# Patient Record
Sex: Female | Born: 1955 | Race: Black or African American | Hispanic: No | Marital: Single | Smoking: Never smoker
Health system: Southern US, Community
[De-identification: ages and names within clinical notes are randomized; demographics above are authoritative.]

## PROBLEM LIST (undated history)

## (undated) DIAGNOSIS — T8859XA Other complications of anesthesia, initial encounter: Secondary | ICD-10-CM

## (undated) DIAGNOSIS — T4145XA Adverse effect of unspecified anesthetic, initial encounter: Secondary | ICD-10-CM

## (undated) DIAGNOSIS — I10 Essential (primary) hypertension: Secondary | ICD-10-CM

## (undated) DIAGNOSIS — M1990 Unspecified osteoarthritis, unspecified site: Secondary | ICD-10-CM

## (undated) DIAGNOSIS — H409 Unspecified glaucoma: Secondary | ICD-10-CM

## (undated) DIAGNOSIS — D649 Anemia, unspecified: Secondary | ICD-10-CM

## (undated) DIAGNOSIS — G5600 Carpal tunnel syndrome, unspecified upper limb: Secondary | ICD-10-CM

## (undated) HISTORY — PX: VAGINAL HYSTERECTOMY: SUR661

## (undated) HISTORY — PX: COLONOSCOPY: SHX174

## (undated) HISTORY — PX: WISDOM TOOTH EXTRACTION: SHX21

## (undated) HISTORY — PX: BUNIONECTOMY: SHX129

---

## 2016-06-25 ENCOUNTER — Other Ambulatory Visit: Payer: Self-pay | Admitting: Neurosurgery

## 2016-07-18 ENCOUNTER — Other Ambulatory Visit (HOSPITAL_COMMUNITY): Payer: Self-pay | Admitting: *Deleted

## 2016-07-18 ENCOUNTER — Encounter (HOSPITAL_COMMUNITY)
Admission: RE | Admit: 2016-07-18 | Discharge: 2016-07-18 | Disposition: A | Discharge status: Home / Self Care | Payer: PRIVATE HEALTH INSURANCE | Source: Ambulatory Visit | Attending: Neurosurgery | Admitting: Neurosurgery

## 2016-07-18 ENCOUNTER — Encounter (HOSPITAL_COMMUNITY): Payer: Self-pay

## 2016-07-18 DIAGNOSIS — Z01812 Encounter for preprocedural laboratory examination: Secondary | ICD-10-CM | POA: Diagnosis present

## 2016-07-18 DIAGNOSIS — Z01810 Encounter for preprocedural cardiovascular examination: Secondary | ICD-10-CM | POA: Insufficient documentation

## 2016-07-18 DIAGNOSIS — I10 Essential (primary) hypertension: Secondary | ICD-10-CM | POA: Diagnosis not present

## 2016-07-18 HISTORY — DX: Unspecified glaucoma: H40.9

## 2016-07-18 HISTORY — DX: Anemia, unspecified: D64.9

## 2016-07-18 HISTORY — DX: Carpal tunnel syndrome, unspecified upper limb: G56.00

## 2016-07-18 HISTORY — DX: Other complications of anesthesia, initial encounter: T88.59XA

## 2016-07-18 HISTORY — DX: Adverse effect of unspecified anesthetic, initial encounter: T41.45XA

## 2016-07-18 HISTORY — DX: Unspecified osteoarthritis, unspecified site: M19.90

## 2016-07-18 HISTORY — DX: Essential (primary) hypertension: I10

## 2016-07-18 LAB — CBC
HEMATOCRIT: 38.3 % (ref 36.0–46.0)
HEMOGLOBIN: 12.5 g/dL (ref 12.0–15.0)
MCH: 23.2 pg — AB (ref 26.0–34.0)
MCHC: 32.6 g/dL (ref 30.0–36.0)
MCV: 71.1 fL — ABNORMAL LOW (ref 78.0–100.0)
Platelets: 289 10*3/uL (ref 150–400)
RBC: 5.39 MIL/uL — ABNORMAL HIGH (ref 3.87–5.11)
RDW: 15.5 % (ref 11.5–15.5)
WBC: 6.1 10*3/uL (ref 4.0–10.5)

## 2016-07-18 LAB — BASIC METABOLIC PANEL
Anion gap: 8 (ref 5–15)
BUN: 16 mg/dL (ref 6–20)
CALCIUM: 9.9 mg/dL (ref 8.9–10.3)
CO2: 25 mmol/L (ref 22–32)
Chloride: 105 mmol/L (ref 101–111)
Creatinine, Ser: 0.89 mg/dL (ref 0.44–1.00)
GFR calc Af Amer: >60 mL/min (ref >60)
GFR calc non Af Amer: >60 mL/min (ref >60)
GLUCOSE: 95 mg/dL (ref 65–99)
POTASSIUM: 4.3 mmol/L (ref 3.5–5.1)
Sodium: 138 mmol/L (ref 135–145)

## 2016-07-18 LAB — SURGICAL PCR SCREEN
MRSA, PCR: NEGATIVE
Staphylococcus aureus: NEGATIVE

## 2016-07-18 LAB — TYPE AND SCREEN
ABO/RH(D): B POS
ANTIBODY SCREEN: NEGATIVE

## 2016-07-18 LAB — ABO/RH: ABO/RH(D): B POS

## 2016-07-18 NOTE — Progress Notes (Signed)
Pt denies cardiac history, chest pain or sob. Pt states she is not diabetic. 

## 2016-07-18 NOTE — Pre-Procedure Instructions (Signed)
Nicoletta Dressatricia Grippi  07/18/2016    Your procedure is scheduled on Friday, July 26, 2016 at 11:00 AM.   [facility] En[facility] "A" Admitting Office at 8:00 AM.   Call this number if you have problems the morning of surgery: [phone]   Questions prior to day of surgery, please call [phone] between 8 & 4 PM.   Remember:  Do not eat food or drink liquids after midnight Thursday, 07/25/16.  Take these medicines the morning of surgery with A SIP OF WATER: Amlodipine (Norvasc), Hydrocodone - if needed  Stop Multivitamins 7 days prior to surgery. Do not use Aspirin products or NSAIDS (Aleve, Ibuprofen, etc.) 7 days prior to surgery.   Do not wear jewelry, make-up or nail polish.  Do not wear lotions, powders, or perfumes.  Do not shave 48 hours prior to surgery.    Do not bring valuables to the hospital.  [facility] is not responsible for any belongings or valuables.  Contacts, dentures or bridgework may not be worn into surgery.  Leave your suitcase in the car.  After surgery it may be brought to your room.  For patients admitted to the hospital, discharge time will be determined by your treatment team.  Special instructions:  [facility] - Preparing for Surgery  Before surgery, you can play an important role.  Because skin is not sterile, your skin needs to be as free of germs as possible.  You can reduce the number of germs on you skin by washing with CHG (chlorahexidine gluconate) soap before surgery.  CHG is an antiseptic cleaner which kills germs and bonds with the skin to continue killing germs even after washing.  Please DO NOT use if you have an allergy to CHG or antibacterial soaps.  If your skin becomes reddened/irritated stop using the CHG and inform your nurse when you arrive at Short Stay.  Do not shave (including legs and underarms) for at least 48 hours prior to the first CHG shower.  You may shave your face.  Please follow these instructions  carefully:   1.  Shower with CHG Soap the night before surgery and the                    morning of Surgery.  2.  If you choose to wash your hair, wash your hair first as usual with your       normal shampoo.  3.  After you shampoo, rinse your hair and body thoroughly to remove the shampoo.  4.  Use CHG as you would any other liquid soap.  You can apply chg directly       to the skin and wash gently with scrungie or a clean washcloth.  5.  Apply the CHG Soap to your body ONLY FROM THE NECK DOWN.        Do not use on open wounds or open sores.  Avoid contact with your eyes, ears, mouth and genitals (private parts).  Wash genitals (private parts) with your normal soap.  6.  Wash thoroughly, paying special attention to the area where your surgery        will be performed.  7.  Thoroughly rinse your body with warm water from the neck down.  8.  DO NOT shower/wash with your normal soap after using and rinsing off       the CHG Soap.  9.  Pat yourself dry with a clean towel.  10.  Wear clean pajamas.            11.  Place clean sheets on your bed the night of your first shower and do not        sleep with pets.  Day of Surgery  Do not apply any lotions the morning of surgery.  Please wear clean clothes to the hospital.   Please read over the fact sheets that you were given.

## 2016-07-26 ENCOUNTER — Inpatient Hospital Stay (HOSPITAL_COMMUNITY): Payer: PRIVATE HEALTH INSURANCE | Admitting: Anesthesiology

## 2016-07-26 ENCOUNTER — Inpatient Hospital Stay (HOSPITAL_COMMUNITY): Payer: PRIVATE HEALTH INSURANCE | Admitting: Emergency Medicine

## 2016-07-26 ENCOUNTER — Inpatient Hospital Stay (HOSPITAL_COMMUNITY): Payer: PRIVATE HEALTH INSURANCE

## 2016-07-26 ENCOUNTER — Encounter (HOSPITAL_COMMUNITY): Payer: Self-pay | Admitting: Anesthesiology

## 2016-07-26 ENCOUNTER — Observation Stay (HOSPITAL_COMMUNITY)
Admission: RE | Admit: 2016-07-26 | Discharge: 2016-07-28 | DRG: 460 | Disposition: A | Discharge status: Home / Self Care | Payer: PRIVATE HEALTH INSURANCE | Source: Ambulatory Visit | Attending: Neurosurgery | Admitting: Neurosurgery

## 2016-07-26 ENCOUNTER — Encounter (HOSPITAL_COMMUNITY)
Admission: RE | Disposition: A | Discharge status: Home / Self Care | Payer: Self-pay | Source: Ambulatory Visit | Attending: Neurosurgery

## 2016-07-26 DIAGNOSIS — M4807 Spinal stenosis, lumbosacral region: Secondary | ICD-10-CM | POA: Diagnosis not present

## 2016-07-26 DIAGNOSIS — M5117 Intervertebral disc disorders with radiculopathy, lumbosacral region: Secondary | ICD-10-CM | POA: Diagnosis not present

## 2016-07-26 DIAGNOSIS — I10 Essential (primary) hypertension: Secondary | ICD-10-CM | POA: Insufficient documentation

## 2016-07-26 DIAGNOSIS — Z79899 Other long term (current) drug therapy: Secondary | ICD-10-CM | POA: Diagnosis not present

## 2016-07-26 DIAGNOSIS — M5116 Intervertebral disc disorders with radiculopathy, lumbar region: Secondary | ICD-10-CM | POA: Diagnosis present

## 2016-07-26 DIAGNOSIS — M4316 Spondylolisthesis, lumbar region: Secondary | ICD-10-CM | POA: Diagnosis present

## 2016-07-26 DIAGNOSIS — Z419 Encounter for procedure for purposes other than remedying health state, unspecified: Secondary | ICD-10-CM

## 2016-07-26 HISTORY — PX: MAXIMUM ACCESS (MAS)POSTERIOR LUMBAR INTERBODY FUSION (PLIF) 2 LEVEL: SHX6369

## 2016-07-26 SURGERY — FOR MAXIMUM ACCESS (MAS) POSTERIOR LUMBAR INTERBODY FUSION (PLIF) 2 LEVEL
Anesthesia: General | Site: Back

## 2016-07-26 MED ORDER — LIDOCAINE HCL (CARDIAC) 20 MG/ML IV SOLN
INTRAVENOUS | Status: DC | PRN
  Administered 2016-07-26: 60 mg via INTRATRACHEAL

## 2016-07-26 MED ORDER — FENTANYL CITRATE (PF) 100 MCG/2ML IJ SOLN
INTRAMUSCULAR | Status: AC
  Filled 2016-07-26: qty 2

## 2016-07-26 MED ORDER — ACETAMINOPHEN 10 MG/ML IV SOLN
INTRAVENOUS | Status: DC | PRN
  Administered 2016-07-26: 1000 mg via INTRAVENOUS

## 2016-07-26 MED ORDER — CEFAZOLIN SODIUM-DEXTROSE 2-4 GM/100ML-% IV SOLN
2.0000 g | INTRAVENOUS | Status: AC
  Administered 2016-07-26: 2 g via INTRAVENOUS
  Filled 2016-07-26: qty 100

## 2016-07-26 MED ORDER — BUPIVACAINE LIPOSOME 1.3 % IJ SUSP
20.0000 mL | INTRAMUSCULAR | Status: AC
  Administered 2016-07-26: 20 mL
  Filled 2016-07-26: qty 20

## 2016-07-26 MED ORDER — METHOCARBAMOL 500 MG PO TABS
500.0000 mg | ORAL_TABLET | Freq: Four times a day (QID) | ORAL | Status: DC | PRN
  Administered 2016-07-26 – 2016-07-28 (×5): 500 mg via ORAL
  Filled 2016-07-26 (×5): qty 1

## 2016-07-26 MED ORDER — SODIUM CHLORIDE 0.9% FLUSH: 3.0000 mL | INTRAVENOUS | Status: DC | PRN

## 2016-07-26 MED ORDER — LIDOCAINE-EPINEPHRINE (PF) 2 %-1:200000 IJ SOLN
INTRAMUSCULAR | Status: AC
  Filled 2016-07-26: qty 20

## 2016-07-26 MED ORDER — LIDOCAINE-EPINEPHRINE (PF) 2 %-1:200000 IJ SOLN
INTRAMUSCULAR | Status: DC | PRN
  Administered 2016-07-26: 5 mL

## 2016-07-26 MED ORDER — AMLODIPINE BESYLATE 10 MG PO TABS
10.0000 mg | ORAL_TABLET | Freq: Every day | ORAL | Status: DC
  Administered 2016-07-27: 10 mg via ORAL
  Filled 2016-07-26: qty 1

## 2016-07-26 MED ORDER — FENTANYL CITRATE (PF) 250 MCG/5ML IJ SOLN
INTRAMUSCULAR | Status: DC | PRN
  Administered 2016-07-26: 100 ug via INTRAVENOUS
  Administered 2016-07-26 (×2): 50 ug via INTRAVENOUS
  Administered 2016-07-26: 100 ug via INTRAVENOUS
  Administered 2016-07-26 (×2): 50 ug via INTRAVENOUS

## 2016-07-26 MED ORDER — OXYCODONE-ACETAMINOPHEN 5-325 MG PO TABS
1.0000 | ORAL_TABLET | ORAL | Status: DC | PRN
  Administered 2016-07-26 – 2016-07-27 (×3): 2 via ORAL
  Administered 2016-07-27: 1 via ORAL
  Administered 2016-07-27 – 2016-07-28 (×2): 2 via ORAL
  Administered 2016-07-28: 1 via ORAL
  Filled 2016-07-26 (×6): qty 2
  Filled 2016-07-26 (×2): qty 1

## 2016-07-26 MED ORDER — METHOCARBAMOL 1000 MG/10ML IJ SOLN
500.0000 mg | Freq: Four times a day (QID) | INTRAVENOUS | Status: DC | PRN
  Filled 2016-07-26: qty 5

## 2016-07-26 MED ORDER — HYDROMORPHONE HCL 1 MG/ML IJ SOLN
0.2500 mg | INTRAMUSCULAR | Status: DC | PRN
  Administered 2016-07-26 (×3): 0.5 mg via INTRAVENOUS

## 2016-07-26 MED ORDER — SODIUM CHLORIDE 0.9 % IV SOLN: 250.0000 mL | INTRAVENOUS | Status: DC

## 2016-07-26 MED ORDER — PROPOFOL 10 MG/ML IV BOLUS
INTRAVENOUS | Status: AC
  Filled 2016-07-26: qty 20

## 2016-07-26 MED ORDER — PROPOFOL 10 MG/ML IV BOLUS
INTRAVENOUS | Status: DC | PRN
  Administered 2016-07-26: 170 mg via INTRAVENOUS

## 2016-07-26 MED ORDER — HYDROMORPHONE HCL 1 MG/ML IJ SOLN
0.5000 mg | INTRAMUSCULAR | Status: DC | PRN
  Administered 2016-07-26 – 2016-07-27 (×2): 1 mg via INTRAVENOUS
  Filled 2016-07-26 (×2): qty 1

## 2016-07-26 MED ORDER — THROMBIN 20000 UNITS EX SOLR
CUTANEOUS | Status: DC | PRN
  Administered 2016-07-26: 13:00:00 via TOPICAL

## 2016-07-26 MED ORDER — HYDROMORPHONE HCL 1 MG/ML IJ SOLN
INTRAMUSCULAR | Status: AC
  Filled 2016-07-26: qty 0.5

## 2016-07-26 MED ORDER — ONDANSETRON HCL 4 MG/2ML IJ SOLN
4.0000 mg | INTRAMUSCULAR | Status: DC | PRN
  Administered 2016-07-26: 4 mg via INTRAVENOUS
  Filled 2016-07-26: qty 2

## 2016-07-26 MED ORDER — CEFAZOLIN IN D5W 1 GM/50ML IV SOLN
1.0000 g | Freq: Three times a day (TID) | INTRAVENOUS | Status: AC
  Administered 2016-07-26 – 2016-07-27 (×2): 1 g via INTRAVENOUS
  Filled 2016-07-26 (×2): qty 50

## 2016-07-26 MED ORDER — CHLORHEXIDINE GLUCONATE CLOTH 2 % EX PADS: 6.0000 | MEDICATED_PAD | Freq: Once | CUTANEOUS | Status: DC

## 2016-07-26 MED ORDER — ACETAMINOPHEN 325 MG PO TABS: 650.0000 mg | ORAL_TABLET | ORAL | Status: DC | PRN

## 2016-07-26 MED ORDER — MENTHOL 3 MG MT LOZG: 1.0000 | LOZENGE | OROMUCOSAL | Status: DC | PRN

## 2016-07-26 MED ORDER — MIDAZOLAM HCL 2 MG/2ML IJ SOLN
INTRAMUSCULAR | Status: AC
  Filled 2016-07-26: qty 2

## 2016-07-26 MED ORDER — THROMBIN 5000 UNITS EX SOLR
CUTANEOUS | Status: AC
  Filled 2016-07-26: qty 5000

## 2016-07-26 MED ORDER — 0.9 % SODIUM CHLORIDE (POUR BTL) OPTIME
TOPICAL | Status: DC | PRN
  Administered 2016-07-26: 1000 mL

## 2016-07-26 MED ORDER — PHENYLEPHRINE HCL 10 MG/ML IJ SOLN
INTRAMUSCULAR | Status: DC | PRN
  Administered 2016-07-26 (×2): 40 ug via INTRAVENOUS
  Administered 2016-07-26: 80 ug via INTRAVENOUS

## 2016-07-26 MED ORDER — SODIUM CHLORIDE 0.9% FLUSH
3.0000 mL | Freq: Two times a day (BID) | INTRAVENOUS | Status: DC
  Administered 2016-07-26 – 2016-07-27 (×2): 3 mL via INTRAVENOUS

## 2016-07-26 MED ORDER — LACTATED RINGERS IV SOLN
INTRAVENOUS | Status: DC | PRN
  Administered 2016-07-26 (×4): via INTRAVENOUS

## 2016-07-26 MED ORDER — ACETAMINOPHEN 10 MG/ML IV SOLN
INTRAVENOUS | Status: AC
  Filled 2016-07-26: qty 100

## 2016-07-26 MED ORDER — HYDROCODONE-ACETAMINOPHEN 5-325 MG PO TABS
1.0000 | ORAL_TABLET | ORAL | Status: DC | PRN
  Administered 2016-07-26 – 2016-07-28 (×2): 2 via ORAL
  Filled 2016-07-26: qty 2

## 2016-07-26 MED ORDER — BUPIVACAINE HCL (PF) 0.5 % IJ SOLN
INTRAMUSCULAR | Status: DC | PRN
  Administered 2016-07-26: 5 mL

## 2016-07-26 MED ORDER — ZOLPIDEM TARTRATE 5 MG PO TABS: 5.0000 mg | ORAL_TABLET | Freq: Every evening | ORAL | Status: DC | PRN

## 2016-07-26 MED ORDER — MIDAZOLAM HCL 5 MG/5ML IJ SOLN
INTRAMUSCULAR | Status: DC | PRN
  Administered 2016-07-26: 2 mg via INTRAVENOUS

## 2016-07-26 MED ORDER — SUCCINYLCHOLINE CHLORIDE 20 MG/ML IJ SOLN
INTRAMUSCULAR | Status: DC | PRN
  Administered 2016-07-26: 120 mg via INTRAVENOUS

## 2016-07-26 MED ORDER — SENNOSIDES-DOCUSATE SODIUM 8.6-50 MG PO TABS: 1.0000 | ORAL_TABLET | Freq: Every evening | ORAL | Status: DC | PRN

## 2016-07-26 MED ORDER — DOCUSATE SODIUM 100 MG PO CAPS
100.0000 mg | ORAL_CAPSULE | Freq: Two times a day (BID) | ORAL | Status: DC
  Administered 2016-07-26 – 2016-07-28 (×4): 100 mg via ORAL
  Filled 2016-07-26 (×4): qty 1

## 2016-07-26 MED ORDER — PHENOL 1.4 % MT LIQD: 1.0000 | OROMUCOSAL | Status: DC | PRN

## 2016-07-26 MED ORDER — THROMBIN 20000 UNITS EX SOLR
CUTANEOUS | Status: AC
  Filled 2016-07-26: qty 20000

## 2016-07-26 MED ORDER — KCL IN DEXTROSE-NACL 20-5-0.45 MEQ/L-%-% IV SOLN
INTRAVENOUS | Status: DC
  Administered 2016-07-26: 20:00:00 via INTRAVENOUS
  Filled 2016-07-26 (×2): qty 1000

## 2016-07-26 MED ORDER — FLEET ENEMA 7-19 GM/118ML RE ENEM: 1.0000 | ENEMA | Freq: Once | RECTAL | Status: DC | PRN

## 2016-07-26 MED ORDER — ACETAMINOPHEN 650 MG RE SUPP: 650.0000 mg | RECTAL | Status: DC | PRN

## 2016-07-26 MED ORDER — BISACODYL 10 MG RE SUPP: 10.0000 mg | Freq: Every day | RECTAL | Status: DC | PRN

## 2016-07-26 MED ORDER — ALBUMIN HUMAN 5 % IV SOLN
INTRAVENOUS | Status: DC | PRN
  Administered 2016-07-26: 15:00:00 via INTRAVENOUS

## 2016-07-26 MED ORDER — LACTATED RINGERS IV SOLN
INTRAVENOUS | Status: DC
  Administered 2016-07-26: 08:00:00 via INTRAVENOUS

## 2016-07-26 MED ORDER — HYDROCODONE-ACETAMINOPHEN 5-325 MG PO TABS
1.0000 | ORAL_TABLET | Freq: Three times a day (TID) | ORAL | Status: DC | PRN
  Administered 2016-07-27: 1 via ORAL
  Filled 2016-07-26: qty 1

## 2016-07-26 MED ORDER — PANTOPRAZOLE SODIUM 40 MG IV SOLR
40.0000 mg | Freq: Every day | INTRAVENOUS | Status: DC
  Administered 2016-07-26 – 2016-07-27 (×2): 40 mg via INTRAVENOUS
  Filled 2016-07-26 (×2): qty 40

## 2016-07-26 MED ORDER — HYDROCODONE-ACETAMINOPHEN 5-325 MG PO TABS
ORAL_TABLET | ORAL | Status: AC
  Filled 2016-07-26: qty 2

## 2016-07-26 MED ORDER — ALUM & MAG HYDROXIDE-SIMETH 200-200-20 MG/5ML PO SUSP: 30.0000 mL | Freq: Four times a day (QID) | ORAL | Status: DC | PRN

## 2016-07-26 MED ORDER — ADULT MULTIVITAMIN W/MINERALS CH
1.0000 | ORAL_TABLET | Freq: Every day | ORAL | Status: DC
  Administered 2016-07-27 – 2016-07-28 (×2): 1 via ORAL
  Filled 2016-07-26 (×3): qty 1

## 2016-07-26 MED FILL — Heparin Sodium (Porcine) Inj 1000 Unit/ML: INTRAMUSCULAR | Qty: 30 | Status: AC

## 2016-07-26 MED FILL — Sodium Chloride IV Soln 0.9%: INTRAVENOUS | Qty: 1000 | Status: AC

## 2016-07-26 SURGICAL SUPPLY — 86 items
BASKET BONE COLLECTION (BASKET) ×2 IMPLANT
BLADE CLIPPER SURG (BLADE) IMPLANT
BONE MATRIX OSTEOCEL PRO MED (Bone Implant) ×3 IMPLANT
BUR MATCHSTICK NEURO 3.0 LAGG (BURR) ×3 IMPLANT
BUR PRECISION FLUTE 5.0 (BURR) IMPLANT
BUR ROUND FLUTED 5 RND (BURR) ×2 IMPLANT
BUR ROUND FLUTED 5MM RND (BURR) ×1
CAGE COROENT LG 10X9X23-12 (Cage) ×12 IMPLANT
CANISTER SUCT 3000ML PPV (MISCELLANEOUS) ×3 IMPLANT
CAP RELINE MOD TULIP RMM (Cap) ×6 IMPLANT
CARTRIDGE OIL MAESTRO DRILL (MISCELLANEOUS) ×1 IMPLANT
CLIP NEUROVISION LG (CLIP) ×3 IMPLANT
CONT SPEC 4OZ CLIKSEAL STRL BL (MISCELLANEOUS) ×3 IMPLANT
COVER BACK TABLE 24X17X13 BIG (DRAPES) IMPLANT
COVER BACK TABLE 60X90IN (DRAPES) ×3 IMPLANT
DECANTER SPIKE VIAL GLASS SM (MISCELLANEOUS) ×3 IMPLANT
DERMABOND ADVANCED (GAUZE/BANDAGES/DRESSINGS) ×2
DERMABOND ADVANCED .7 DNX12 (GAUZE/BANDAGES/DRESSINGS) ×1 IMPLANT
DIFFUSER DRILL AIR PNEUMATIC (MISCELLANEOUS) ×3 IMPLANT
DRAPE C-ARM 42X72 X-RAY (DRAPES) ×3 IMPLANT
DRAPE C-ARMOR (DRAPES) ×3 IMPLANT
DRAPE LAPAROTOMY 100X72X124 (DRAPES) ×3 IMPLANT
DRAPE POUCH INSTRU U-SHP 10X18 (DRAPES) ×3 IMPLANT
DRAPE SURG 17X23 STRL (DRAPES) ×3 IMPLANT
DRSG OPSITE POSTOP 4X6 (GAUZE/BANDAGES/DRESSINGS) ×3 IMPLANT
DURAPREP 26ML APPLICATOR (WOUND CARE) ×3 IMPLANT
ELECT BLADE 4.0 EZ CLEAN MEGAD (MISCELLANEOUS) ×3
ELECT REM PT RETURN 9FT ADLT (ELECTROSURGICAL) ×3
ELECTRODE BLDE 4.0 EZ CLN MEGD (MISCELLANEOUS) ×1 IMPLANT
ELECTRODE REM PT RTRN 9FT ADLT (ELECTROSURGICAL) ×1 IMPLANT
EVACUATOR 1/8 PVC DRAIN (DRAIN) IMPLANT
GAUZE SPONGE 4X4 12PLY STRL (GAUZE/BANDAGES/DRESSINGS) IMPLANT
GAUZE SPONGE 4X4 16PLY XRAY LF (GAUZE/BANDAGES/DRESSINGS) IMPLANT
GLOVE BIO SURGEON STRL SZ8 (GLOVE) ×6 IMPLANT
GLOVE BIOGEL PI IND STRL 6.5 (GLOVE) ×1 IMPLANT
GLOVE BIOGEL PI IND STRL 8 (GLOVE) ×2 IMPLANT
GLOVE BIOGEL PI IND STRL 8.5 (GLOVE) ×2 IMPLANT
GLOVE BIOGEL PI INDICATOR 6.5 (GLOVE) ×2
GLOVE BIOGEL PI INDICATOR 8 (GLOVE) ×4
GLOVE BIOGEL PI INDICATOR 8.5 (GLOVE) ×4
GLOVE ECLIPSE 8.0 STRL XLNG CF (GLOVE) ×6 IMPLANT
GLOVE ECLIPSE 9.0 STRL (GLOVE) ×3 IMPLANT
GLOVE EXAM NITRILE LRG STRL (GLOVE) IMPLANT
GLOVE EXAM NITRILE XL STR (GLOVE) IMPLANT
GLOVE EXAM NITRILE XS STR PU (GLOVE) IMPLANT
GLOVE SURG SS PI 6.5 STRL IVOR (GLOVE) ×3 IMPLANT
GLOVE SURG SS PI 7.0 STRL IVOR (GLOVE) ×12 IMPLANT
GOWN STRL REUS W/ TWL LRG LVL3 (GOWN DISPOSABLE) ×3 IMPLANT
GOWN STRL REUS W/ TWL XL LVL3 (GOWN DISPOSABLE) ×3 IMPLANT
GOWN STRL REUS W/TWL 2XL LVL3 (GOWN DISPOSABLE) ×6 IMPLANT
GOWN STRL REUS W/TWL LRG LVL3 (GOWN DISPOSABLE) ×6
GOWN STRL REUS W/TWL XL LVL3 (GOWN DISPOSABLE) ×6
KIT BASIN OR (CUSTOM PROCEDURE TRAY) ×3 IMPLANT
KIT POSITION SURG JACKSON T1 (MISCELLANEOUS) ×3 IMPLANT
KIT ROOM TURNOVER OR (KITS) ×3 IMPLANT
MODULE NVM5 NEXT GEN EMG (NEEDLE) ×3 IMPLANT
NEEDLE HYPO 25X1 1.5 SAFETY (NEEDLE) ×3 IMPLANT
NEEDLE SPNL 18GX3.5 QUINCKE PK (NEEDLE) ×3 IMPLANT
NS IRRIG 1000ML POUR BTL (IV SOLUTION) ×3 IMPLANT
OIL CARTRIDGE MAESTRO DRILL (MISCELLANEOUS) ×3
PACK LAMINECTOMY NEURO (CUSTOM PROCEDURE TRAY) ×3 IMPLANT
PAD ARMBOARD 7.5X6 YLW CONV (MISCELLANEOUS) ×6 IMPLANT
PATTIES SURGICAL .5 X.5 (GAUZE/BANDAGES/DRESSINGS) IMPLANT
PATTIES SURGICAL .5 X1 (DISPOSABLE) IMPLANT
PATTIES SURGICAL 1X1 (DISPOSABLE) IMPLANT
PENCIL BUTTON HOLSTER BLD 10FT (ELECTRODE) ×3 IMPLANT
ROD RELINE COCR 5.0X60MM (Rod) ×2 IMPLANT
ROD RELINE-O COCR 5.0X55MM (Rod) ×3 IMPLANT
SCREW LOCK RSS 4.5/5.0MM (Screw) ×18 IMPLANT
SCREW RELINE RMM 5.5X35 4S (Screw) ×4 IMPLANT
SCREW RELINE RMM 6.5X35 4S (Screw) ×6 IMPLANT
SCREW SHANK RELINE MOD 5.5X35 (Screw) ×6 IMPLANT
SPONGE LAP 4X18 X RAY DECT (DISPOSABLE) IMPLANT
SPONGE SURGIFOAM ABS GEL 100 (HEMOSTASIS) ×3 IMPLANT
STAPLER SKIN PROX WIDE 3.9 (STAPLE) IMPLANT
SUT VIC AB 1 CT1 18XBRD ANBCTR (SUTURE) ×2 IMPLANT
SUT VIC AB 1 CT1 8-18 (SUTURE) ×4
SUT VIC AB 2-0 CT1 18 (SUTURE) ×6 IMPLANT
SUT VIC AB 3-0 SH 8-18 (SUTURE) ×6 IMPLANT
SYR 3ML LL SCALE MARK (SYRINGE) IMPLANT
SYR 5ML LL (SYRINGE) IMPLANT
SYSTEM NUVAMAP OR (SYSTAGENIX WOUND MANAGEMENT) ×3 IMPLANT
TOWEL OR 17X24 6PK STRL BLUE (TOWEL DISPOSABLE) ×3 IMPLANT
TOWEL OR 17X26 10 PK STRL BLUE (TOWEL DISPOSABLE) ×3 IMPLANT
TRAY FOLEY W/METER SILVER 16FR (SET/KITS/TRAYS/PACK) ×3 IMPLANT
WATER STERILE IRR 1000ML POUR (IV SOLUTION) ×3 IMPLANT

## 2016-07-26 NOTE — Transfer of Care (Signed)
Immediate Anesthesia Transfer of Care Note  Patient: Stacey Gordon  Procedure(s) Performed: Procedure(s): Lumbar four-five, Lumbar five-Sacral one MAXIMUM ACCESS POSTERIOR LUMBAR INTERBODY FUSION (N/A)  Patient Location: PACU  Anesthesia Type:General  Level of Consciousness: awake, alert , oriented and patient cooperative  Airway & Oxygen Therapy: Patient Spontanous Breathing and Patient connected to nasal cannula oxygen  Post-op Assessment: Report given to RN, Post -op Vital signs reviewed and stable and Patient moving all extremities  Post vital signs: Reviewed and stable  Last Vitals:  Vitals:   07/26/16 0813 07/26/16 1617  BP: (!) 188/67 101/69  Pulse: 81 73  Resp: 20 13  Temp: 36.8 C 36.1 C    Last Pain:  Vitals:   07/26/16 1617  TempSrc:   PainSc: Asleep         Complications: No apparent anesthesia complications

## 2016-07-26 NOTE — Progress Notes (Signed)
Awake, alert, conversant.  MAEW with full strength.  Patient is doing well.

## 2016-07-26 NOTE — Anesthesia Preprocedure Evaluation (Addendum)
Anesthesia Evaluation  Patient identified by MRN, date of birth, ID band Patient awake    Reviewed: Allergy & Precautions, H&P , NPO status , Patient's Chart, lab work & pertinent test results  Airway Mallampati: II  TM Distance: >3 FB Neck ROM: Full    Dental no notable dental hx. (+) Teeth Intact, Dental Advisory Given   Pulmonary neg pulmonary ROS,    Pulmonary exam normal breath sounds clear to auscultation       Cardiovascular hypertension, Pt. on medications  Rhythm:Regular Rate:Normal     Neuro/Psych negative neurological ROS  negative psych ROS   GI/Hepatic negative GI ROS, Neg liver ROS,   Endo/Other  negative endocrine ROS  Renal/GU negative Renal ROS  negative genitourinary   Musculoskeletal  (+) Arthritis , Osteoarthritis,    Abdominal   Peds  Hematology negative hematology ROS (+)   Anesthesia Other Findings   Reproductive/Obstetrics negative OB ROS                            Anesthesia Physical Anesthesia Plan  ASA: II  Anesthesia Plan: General   Post-op Pain Management:    Induction: Intravenous  Airway Management Planned: Oral ETT  Additional Equipment:   Intra-op Plan:   Post-operative Plan: Extubation in OR  Informed Consent: I have reviewed the patients History and Physical, chart, labs and discussed the procedure including the risks, benefits and alternatives for the proposed anesthesia with the patient or authorized representative who has indicated his/her understanding and acceptance.   Dental advisory given  Plan Discussed with: CRNA, Anesthesiologist and Surgeon  Anesthesia Plan Comments:        Anesthesia Quick Evaluation

## 2016-07-26 NOTE — H&P (Signed)
Patient ID:   [id number] Patient: Stacey Gordon  Date of Birth: 1956/04/12 Visit Type: Office Visit   Date: 06/24/2016 09:30 AM Provider: Danae Orleans. Venetia Maxon MD   This 60 year old female presents for back pain.  History of Present Illness: 1.  back pain    06/10/16 right L5 TF ESI by Dr. Ollen Bowl offered relief 2 days only per patient  Physical therapy caused increased pain, stopped  Right buttock and right lower extremity pain persists.  He remains out of work per Dr. Merleen Milliner her primary physician in [place]. She is not bearing weight on her right leg.   Norco 5/325 taken only as needed at night Advil or Aleve taken during the day  Physcia Exam: 4/5 right dorsiflexion and hip abductor weakness, positive SLR on the right       PAST MEDICAL HISTORY, SURGICAL HISTORY, FAMILY HISTORY, SOCIAL HISTORY AND REVIEW OF SYSTEMS I have reviewed the patient's past medical, surgical, family and social history as well as the comprehensive review of systems as included on the [facility] history form dated 06/10/2016, which I have signed.   MEDICATIONS(added, continued or stopped this visit): Started Medication Directions Instruction Stopped   amlodipine take 1 tablet by oral route  every day    05/15/2016 hydrocodone 5 mg-acetaminophen 325 mg tablet take 1 tablet by oral route  every 8 hours as needed for pain       ALLERGIES: Ingredient Reaction Medication Name Comment  NO KNOWN ALLERGIES     No known allergies.    Vitals Date Temp F BP Pulse Ht In Wt Lb BMI BSA Pain Score  06/24/2016  177/81 93 64 184 31.58  8/10      IMPRESSION The patient got little relief from her targeted injection with Dr. Ollen Bowl and PT caused increased pain. On further review of her imaging, she has stenosis and spondylolisthesis at L4-5, L5-S1 that is causing significant nerve root compression. On confrontational testing, she is weak in her right dorsiflexion and hip  abductors and has a positive SLR on the right. Today, she cannot bear any weight on her right leg because of the pain. Therefore, I recommend an L4-5, L5-S1 MAS PLIF for alleviation of her low back and right leg symptoms.   Assessment/Plan # Detail Type Description   1. Assessment Lumbar radiculopathy (M54.16).       2. Assessment Spondylolisthesis, lumbar region (M43.16).       3. Assessment Low back pain, unspecified back pain laterality, with sciatica presence unspecified (M54.5).       4. Assessment Disc displacement, lumbar (M51.26).       5. Assessment Spinal stenosis of lumbosacral region (M48.07).   Plan Orders Aspen Lo Sag Rigid Panel Quick.         Schedule L4-5, L5-S1 MAS PLIF. Nurse education given. Fitted for LSO brace.   Orders: Diagnostic Procedures: Assessment Procedure  M54.16 Lumbar Spine- AP/Lat  Miscellaneous: Assessment   M48.07 Aspen Lo Sag Rigid Panel Quick             Provider:  Danae Orleans. Venetia Maxon MD  06/24/2016 10:36 AM Dictation edited by: Gabriel Rainwater    CC Providers: Ardean Larsen II [facility] [address] [place], [place] [address]-              Electronically signed by Danae Orleans Venetia Maxon MD on 06/24/2016 10:37 AM  Patient ID:   [id number] Patient: Stacey Gordon  Date of  Birth: Oct 23, 1955 Visit Type: Chart Update   Date: 05/15/2016 09:15 AM Provider: Danae OrleansJoseph D. Venetia MaxonStern MD   This 60 year old female presents for back pain.  History of Present Illness: 1.  back pain    Stacey Dressatricia Milledge, 60 year old female employed at [employer], visits for evaluation of lumbar and right lower extremity pain.  She recalls increasing pain over the past six months without recollection of injury.  She was taken out of work September 25 by Dr. Merleen MillinerWinfield due to increasing right leg pain and swelling. Her right leg pain radiates into the top of her foot and into her big toe. She states she is weak in her leg. Pain is 7-8/10  today. She has a very heavy lifting job.  Lidocaine patch provides temporary relief only  Chiropractic treatments offered no relief  History: HTN Surgical history: Hysterectomy 1983; bunionectomy right foot 2008  X-rays on Canopy reveal L4-5, L5-S1 facet arthritis, L4-5 anterolisthesis of 7 mm in neutral, 8.5 mm in flexion, and 6 mm in extension  03/26/16 MRI: Impingement of the exiting left L4 nerve root at L4-5 described above. Impingement of the exiting right L5 nerve root at L5-S1 described above.       PAST MEDICAL HISTORY, SURGICAL HISTORY, FAMILY HISTORY, SOCIAL HISTORY AND REVIEW OF SYSTEMS  05/15/2016, which I have signed.  FAMILY HISTORY Patient reports there is no relevant family history.    MEDICATIONS(added, continued or stopped this visit): Started Medication Directions Instruction Stopped   amlodipine take 1 tablet by oral route  every day    05/15/2016 hydrocodone 5 mg-acetaminophen 325 mg tablet take 1 tablet by oral route  every 8 hours as needed for pain       ALLERGIES: Ingredient Reaction Medication Name Comment  NO KNOWN ALLERGIES     No known allergies.    Vitals Date Temp F BP Pulse Ht In Wt Lb BMI BSA Pain Score  05/15/2016  158/82 99 64 182 31.24  8/10     PHYSICAL EXAM General Level of Distress: no acute distress Overall Appearance: normal    Cardiovascular Cardiac: regular rate and rhythm without murmur  Respiratory Lungs: clear to auscultation  Neurological Recent and Remote Memory: normal Attention Span and Concentration:   normal Language: normal Fund of Knowledge: normal  Right Left Sensation: normal normal Upper Extremity Coordination: normal normal  Lower Extremity Coordination: normal normal  Musculoskeletal Gait and Station: normal  Right Left Upper Extremity Muscle Strength: normal normal Lower Extremity Muscle Strength: normal normal Upper Extremity Muscle Tone:  normal normal Lower Extremity Muscle  Tone: normal normal  Motor Strength Upper and lower extremity motor strength was tested in the clinically pertinent muscles. Any abnormal findings will be noted below.   Right Left EHL: 4/5    Deep Tendon Reflexes  Right Left Biceps: increase increase Triceps: increase increase Brachiloradialis: increase increase Patellar: increase increase Achilles: increase increase  Sensory Sensation was tested at L1 to S1. Any abnormal findings will be noted below.  Right Left L4: decreased   L5: decreased   S1: decreased   Cranial Nerves II. Optic Nerve/Visual Fields: normal III. Oculomotor: normal IV. Trochlear: normal V. Trigeminal: normal VI. Abducens: normal VII. Facial: normal VIII. Acoustic/Vestibular: normal IX. Glossopharyngeal: normal X. Vagus: normal XI. Spinal Accessory: normal XII. Hypoglossal: normal  Motor and other Tests Lhermittes: negative Rhomberg: negative    Right Left Hoffman's: normal normal Clonus: normal normal Babinski: normal normal SLR: positive negative Patrick's Pearlean Brownie(Faber): negative negative Toe Walk: normal normal  Toe Lift: abnormal normal Heel Walk: normal normal SI Joint: nontender nontender   Additional Findings:  She is not bearing full weight on her right leg, pain to palpation on low back, right sciatic notch discomfort, toe touch 4 inches from floor, diffficult standing on toes or heels on the right, weak in right leg squat, left LE strength is normal, 4/5 right dorsiflexion and hip abductor weakness, positive SLR of 30 degrees on the right   DIAGNOSTIC RESULTS X-rays on Canopy reveal L4-5, L5-S1 facet arthritis, L4-5 anterolisthesis of 7 mm in neutral, 8.5 mm in flexion, and 6 mm in extension  03/26/16 MRI: Impingement of the exiting left L4 nerve root at L4-5 described above. Impingement of the exiting right L5 nerve root at L5-S1 described above.    IMPRESSION The patient is experiencing low back and right leg pain/weakness. On  review of her imaging, she has significant facet arthritis and degeneration at L4-5 and L5-S1. These changes are causing stenosis and impingement of the exiting L4 and L5 nerve roots. On confrontational testing, she has a positive SLR on the right, right EHL, dorsiflexion, and hip abductor weakness, decreased pin prick sensation at L4, L5, and S1 on the right, and mild hyperreflexia. She also is not bearing full weight on her right leg and has difficulty standing on her toes and heels. I believe surgical intervention may be warranted, but the patient would like to try more conservative treatment first. I recommend a right L5 SNRB with Dr. Ollen BowlHarkins and we will also order PT for further alleviation of her low back and right leg symptoms. She has FMLA through Dr. Merleen MillinerWinfield until the end of the year, so she will remain out of work as she has a heavy lifting job.   Completed Orders (this encounter) Order Details Reason Side Interpretation Result Initial Treatment Date Region  Lifestyle education follow up with primary physcian for elevated blood pressure.        Giving encouragement to exercise Encourage patient to eat a well balanced diet and follow up with primary physcian.        Lumbar Spine- AP/Lat/Obls/Spot/Flex/Ex      05/15/2016 All Levels to All Levels   Assessment/Plan # Detail Type Description   1. Assessment Essential (primary) hypertension (I10).       2. Assessment Body mass index (BMI) 31.0-31.9, adult (Z68.31).   Plan Orders Today's instructions / counseling include(s) Giving encouragement to exercise.       3. Assessment Lumbar radiculopathy (M54.16).         Pain Assessment/Treatment Pain Scale: 8/10. Method: Numeric Pain Intensity Scale. Location: back. Duration: varies. Quality: discomforting. Pain Assessment/Treatment follow-up plan of care: Patient taking over the counter medication for relief..  Fall Risk Plan The patient has not fallen in the last year.  We discussed  the importance of weight loss. Prescribed hydrocodone. Schedule right L5 SNRB with Dr. Ollen BowlHarkins. Ordered PT. Follow up in 6-8 weeks.    Orders: Diagnostic Procedures: Assessment Procedure  M54.16 Lumbar Spine- AP/Lat/Obls/Spot/Flex/Ex  Instruction(s)/Education: Assessment Instruction  I10 Lifestyle education  Z68.31 Giving encouragement to exercise    MEDICATIONS PRESCRIBED TODAY    Rx Quantity Refills  HYDROCODONE-ACETAMINOPHEN 5 mg-325 mg  60 0            Provider:  Danae OrleansJoseph D. Venetia MaxonStern MD  05/15/2016 12:13 PM Dictation edited by: Gabriel RainwaterSamuel F. McBride    CC Providers: Ardean LarsenAlbert Winfield II [facility] [address] [place], [place] [id number]-  Electronically signed by Danae Orleans. Venetia Maxon MD on 05/16/2016 04:52 PM

## 2016-07-26 NOTE — Brief Op Note (Signed)
07/26/2016  4:23 PM  PATIENT:  Stacey HolesPatricia L Gordon  60 y.o. female  PRE-OPERATIVE DIAGNOSIS:  Herniated lumbar disc, lumbar spondylolisthesis, SPINAL STENOSIS OF LUMBOSACRAL REGION, lumbar radiculopathy, lumbago L 45, L 5 S 1  POST-OPERATIVE DIAGNOSIS:  Herniated lumbar disc, lumbar spondylolisthesis, SPINAL STENOSIS OF LUMBOSACRAL REGION, lumbar radiculopathy, lumbago L 45, L 5 S 1  PROCEDURE:  Procedure(s): Lumbar four-five, Lumbar five-Sacral one MAXIMUM ACCESS POSTERIOR LUMBAR INTERBODY FUSION (N/A) with PEEK interbody cages, pedicle screw fixation, posterolateral arthrodesis  Decompression greater than for standard PLIF procedure  SURGEON:  Surgeon(s) and Role:    * Maeola HarmanJoseph Kesa Birky, MD - Primary  PHYSICIAN ASSISTANT:   ASSISTANTS: [place], RN   ANESTHESIA:   general  EBL:  Total I/O In: 2550 [I.V.:2300; IV Piggyback:250] Out: 355 [Urine:105; Blood:250]  BLOOD ADMINISTERED:none  DRAINS: none   LOCAL MEDICATIONS USED:  MARCAINE    and LIDOCAINE   SPECIMEN:  No Specimen  DISPOSITION OF SPECIMEN:  N/A  COUNTS:  YES  TOURNIQUET:  * No tourniquets in log *  DICTATION: DICTATION: Patient is a 60 year old with spondylolisthesis , stenosis, disc herniation and severe back and bilateral lower extremity pain with right leg weakness at L4/5 and L 5 S 1 levels of the lumbar spine. It was elected to take him to surgery for MASPLIF at  L 45 and L 5 S 1  levels with posterolateral arthrodesis.  Procedure:   Following uncomplicated induction of GETA, and placement of electrodes for neural monitoring, patient was turned into a prone position on the [place] tableand using AP  fluoroscopy the area of planned incision was marked, prepped with betadine scrub and Duraprep, then draped. Exposure was performed of facet joint complex at L 45 and L 5 S 1 levels and the MAS retractor was placed.5.5 x 35 mm cortical Nuvasive screws were placed at L 4 bilaterally according to standard landmarks using  neural monitoring.  A total laminectomy of L 4 and L 5 was then performed with disarticulation of facets.  Decompression was greater than for standard PLIF procedure and thorough decompression of the thecal sac, bilateral L 4, L 5, S1 nerve roots was performed. There was significant foraminal compression of the L 5 nerve root on the right at the L 5 S 1 level. Along with foraminal and extraforaminal portions of these nerve roots.  This bone was saved for grafting, combined with Osteocel after being run through bone mill and was placed in bone packing device.  Thorough discectomy was performed bilaterally at L 5 S 1  and the endplates were prepared for grafting.  23 x 10 x 12 degree cages were placed in the interspace and positioning was confirmed with AP and lateral fluoroscopy.  10 cc of autograft/Osteocel was packed in the interspace medial to the second cage.   Thorough discectomy was performed bilaterally at L 45  and the endplates were prepared for grafting.  23 x 10 x 12 degree cages were placed in the interspace and positioning was confirmed with AP and lateral fluoroscopy.  10 cc of autograft/Osteocel was packed in the interspace medial to the second cage.   Remaining screws were placed at L 5 and and S 1 and  rods were placed. And the screws were locked and torqued.Final Xrays showed well positioned implants and screw fixation. The posterolateral region on the left was packed with 20 cc of autograft on each side. The wounds were irrigated and then closed with 1, 2-0 and 3-0 Vicryl stitches. 20  cc long-acting Marcaine was injected into the musculature.  Sterile occlusive dressing was placed with Dermabond and an occlusive dressing. The patient was then extubated in the operating room and taken to recovery in stable and satisfactory condition having tolerated her operation well. Counts were correct at the end of the case.  PLAN OF CARE: Admit to inpatient   PATIENT DISPOSITION:  PACU - hemodynamically  stable.   Delay start of Pharmacological VTE agent (>24hrs) due to surgical blood loss or risk of bleeding: yes

## 2016-07-26 NOTE — Anesthesia Procedure Notes (Signed)
Procedure Name: Intubation Date/Time: 07/26/2016 12:10 PM Performed by: Brien MatesMAHONY, Sherry Blackard D Pre-anesthesia Checklist: Patient identified, Emergency Drugs available, Patient being monitored, Suction available and Timeout performed Patient Re-evaluated:Patient Re-evaluated prior to inductionOxygen Delivery Method: Circle system utilized Preoxygenation: Pre-oxygenation with 100% oxygen Intubation Type: IV induction Ventilation: Mask ventilation without difficulty and Oral airway inserted - appropriate to patient size Laryngoscope Size: Miller and 2 Grade View: Grade I Tube type: Oral Tube size: 7.5 mm Number of attempts: 1 Airway Equipment and Method: Stylet Placement Confirmation: ETT inserted through vocal cords under direct vision,  positive ETCO2 and breath sounds checked- equal and bilateral Secured at: 22 cm Tube secured with: Tape Dental Injury: Teeth and Oropharynx as per pre-operative assessment

## 2016-07-26 NOTE — Interval H&P Note (Signed)
History and Physical Interval Note:  07/26/2016 12:08 PM  Stacey HolesPatricia L Gordon  has presented today for surgery, with the diagnosis of SPINAL STENOSIS OF LUMBOSACRAL REGION  The various methods of treatment have been discussed with the patient and family. After consideration of risks, benefits and other options for treatment, the patient has consented to  Procedure(s) with comments: L4-5 L5-S1 MAXIMUM ACCESS POSTERIOR LUMBAR INTERBODY FUSION (N/A) - L4-5 L5-S1 MAXIMUM ACCESS POSTERIOR LUMBAR INTERBODY FUSION as a surgical intervention .  The patient's history has been reviewed, patient examined, no change in status, stable for surgery.  I have reviewed the patient's chart and labs.  Questions were answered to the patient's satisfaction.     [name]

## 2016-07-26 NOTE — Op Note (Signed)
07/26/2016  4:23 PM  PATIENT:  Stacey Gordon  60 y.o. female  PRE-OPERATIVE DIAGNOSIS:  Herniated lumbar disc, lumbar spondylolisthesis, SPINAL STENOSIS OF LUMBOSACRAL REGION, lumbar radiculopathy, lumbago L 45, L 5 S 1  POST-OPERATIVE DIAGNOSIS:  Herniated lumbar disc, lumbar spondylolisthesis, SPINAL STENOSIS OF LUMBOSACRAL REGION, lumbar radiculopathy, lumbago L 45, L 5 S 1  PROCEDURE:  Procedure(s): Lumbar four-five, Lumbar five-Sacral one MAXIMUM ACCESS POSTERIOR LUMBAR INTERBODY FUSION (N/A) with PEEK interbody cages, pedicle screw fixation, posterolateral arthrodesis  Decompression greater than for standard PLIF procedure  SURGEON:  Surgeon(s) and Role:    * Ayari Liwanag, MD - Primary  PHYSICIAN ASSISTANT:   ASSISTANTS: Poteat, RN   ANESTHESIA:   general  EBL:  Total I/O In: 2550 [I.V.:2300; IV Piggyback:250] Out: 355 [Urine:105; Blood:250]  BLOOD ADMINISTERED:none  DRAINS: none   LOCAL MEDICATIONS USED:  MARCAINE    and LIDOCAINE   SPECIMEN:  No Specimen  DISPOSITION OF SPECIMEN:  N/A  COUNTS:  YES  TOURNIQUET:  * No tourniquets in log *  DICTATION: DICTATION: Patient is a 60-year-old with spondylolisthesis , stenosis, disc herniation and severe back and bilateral lower extremity pain with right leg weakness at L4/5 and L 5 S 1 levels of the lumbar spine. It was elected to take him to surgery for MASPLIF at  L 45 and L 5 S 1  levels with posterolateral arthrodesis.  Procedure:   Following uncomplicated induction of GETA, and placement of electrodes for neural monitoring, patient was turned into a prone position on the Jackson tableand using AP  fluoroscopy the area of planned incision was marked, prepped with betadine scrub and Duraprep, then draped. Exposure was performed of facet joint complex at L 45 and L 5 S 1 levels and the MAS retractor was placed.5.5 x 35 mm cortical Nuvasive screws were placed at L 4 bilaterally according to standard landmarks using  neural monitoring.  A total laminectomy of L 4 and L 5 was then performed with disarticulation of facets.  Decompression was greater than for standard PLIF procedure and thorough decompression of the thecal sac, bilateral L 4, L 5, S1 nerve roots was performed. There was significant foraminal compression of the L 5 nerve root on the right at the L 5 S 1 level. Along with foraminal and extraforaminal portions of these nerve roots.  This bone was saved for grafting, combined with Osteocel after being run through bone mill and was placed in bone packing device.  Thorough discectomy was performed bilaterally at L 5 S 1  and the endplates were prepared for grafting.  23 x 10 x 12 degree cages were placed in the interspace and positioning was confirmed with AP and lateral fluoroscopy.  10 cc of autograft/Osteocel was packed in the interspace medial to the second cage.   Thorough discectomy was performed bilaterally at L 45  and the endplates were prepared for grafting.  23 x 10 x 12 degree cages were placed in the interspace and positioning was confirmed with AP and lateral fluoroscopy.  10 cc of autograft/Osteocel was packed in the interspace medial to the second cage.   Remaining screws were placed at L 5 and and S 1 and  rods were placed. And the screws were locked and torqued.Final Xrays showed well positioned implants and screw fixation. The posterolateral region on the left was packed with 20 cc of autograft on each side. The wounds were irrigated and then closed with 1, 2-0 and 3-0 Vicryl stitches. 20   cc long-acting Marcaine was injected into the musculature.  Sterile occlusive dressing was placed with Dermabond and an occlusive dressing. The patient was then extubated in the operating room and taken to recovery in stable and satisfactory condition having tolerated her operation well. Counts were correct at the end of the case.  PLAN OF CARE: Admit to inpatient   PATIENT DISPOSITION:  PACU - hemodynamically  stable.   Delay start of Pharmacological VTE agent (>24hrs) due to surgical blood loss or risk of bleeding: yes

## 2016-07-26 NOTE — Anesthesia Postprocedure Evaluation (Signed)
Anesthesia Post Note  Patient: Stacey Gordon  Procedure(s) Performed: Procedure(s) (LRB): Lumbar four-five, Lumbar five-Sacral one MAXIMUM ACCESS POSTERIOR LUMBAR INTERBODY FUSION (N/A)  Patient location during evaluation: PACU Anesthesia Type: General Level of consciousness: awake and alert Pain management: pain level controlled Vital Signs Assessment: post-procedure vital signs reviewed and stable Respiratory status: spontaneous breathing, nonlabored ventilation, respiratory function stable and patient connected to nasal cannula oxygen Cardiovascular status: blood pressure returned to baseline and stable Postop Assessment: no signs of nausea or vomiting Anesthetic complications: no    Last Vitals:  Vitals:   07/26/16 1746 07/26/16 1750  BP: 136/70   Pulse: 68   Resp: 12   Temp:  36.3 C    Last Pain:  Vitals:   07/26/16 1700  TempSrc:   PainSc: 10-Worst pain ever                 Petr Bontempo,W. EDMOND

## 2016-07-26 NOTE — Interval H&P Note (Signed)
History and Physical Interval Note:  07/26/2016 12:08 PM  Stacey Gordon  has presented today for surgery, with the diagnosis of SPINAL STENOSIS OF LUMBOSACRAL REGION  The various methods of treatment have been discussed with the patient and family. After consideration of risks, benefits and other options for treatment, the patient has consented to  Procedure(s) with comments: L4-5 L5-S1 MAXIMUM ACCESS POSTERIOR LUMBAR INTERBODY FUSION (N/A) - L4-5 L5-S1 MAXIMUM ACCESS POSTERIOR LUMBAR INTERBODY FUSION as a surgical intervention .  The patient's history has been reviewed, patient examined, no change in status, stable for surgery.  I have reviewed the patient's chart and labs.  Questions were answered to the patient's satisfaction.     Elia Nunley D   

## 2016-07-27 DIAGNOSIS — M5116 Intervertebral disc disorders with radiculopathy, lumbar region: Secondary | ICD-10-CM | POA: Diagnosis not present

## 2016-07-27 NOTE — Evaluation (Signed)
Occupational Therapy Evaluation Patient Details Name: Stacey Gordon MRN: [id number] DOB: 08-Jun-1956 Today's Date: 07/27/2016    History of Present Illness 60 y.o. female s/p Lumbar four-five, Lumbar five-Sacral one MAXIMUM ACCESS POSTERIOR LUMBAR INTERBODY FUSION .   Clinical Impression   Pt admitted with above. She demonstrates the below listed deficits and will benefit from continued OT to maximize safety and independence with BADLs.  Pt has good family support at home.  She requires mod A for LB ADLs       Follow Up Recommendations  No OT follow up;Supervision/Assistance - 24 hour    Equipment Recommendations  3 in 1 bedside commode    Recommendations for Other Services       Precautions / Restrictions Precautions Precautions: Back Precaution Booklet Issued: Yes (comment) Precaution Comments: Reviewed handout.  Pt able to state 3/3 back precautions  Required Braces or Orthoses: Spinal Brace Spinal Brace: Lumbar corset Restrictions Weight Bearing Restrictions: No      Mobility Bed Mobility                  Transfers Overall transfer level: Needs assistance Equipment used: Rolling walker (2 wheeled) Transfers: Sit to/from BJ'sStand;Stand Pivot Transfers Sit to Stand: Supervision Stand pivot transfers: Supervision            Balance                                            ADL Overall ADL's : Needs assistance/impaired Eating/Feeding: Independent   Grooming: Wash/dry hands;Wash/dry face;Oral care;Set up;Sitting Grooming Details (indicate cue type and reason): reviewed safe technique for brushing teeth to avoid bending  Upper Body Bathing: Set up;Supervision/ safety;Sitting   Lower Body Bathing: Moderate assistance;Sit to/from stand Lower Body Bathing Details (indicate cue type and reason): Pt unable to cross ankles over knees  Upper Body Dressing : Supervision/safety;Sitting   Lower Body Dressing: Maximal assistance;Sit  to/from stand   Toilet Transfer: Min guard;Ambulation;Comfort height toilet;BSC;Grab bars;RW   Toileting- Clothing Manipulation and Hygiene: Moderate assistance;Sit to/from stand       Functional mobility during ADLs: Min guard;Rolling walker General ADL Comments: Discussed use of AE with pt and daughter.  Pt reports she will have assistance as long as she needs it      Vision     Perception     Praxis      Pertinent Vitals/Pain Pain Assessment: 0-10 Pain Score: 6  Pain Location: back Pain Descriptors / Indicators: Aching Pain Intervention(s): Monitored during session     Hand Dominance Right   Extremity/Trunk Assessment Upper Extremity Assessment Upper Extremity Assessment: Overall WFL for tasks assessed   Lower Extremity Assessment Lower Extremity Assessment: Defer to PT evaluation       Communication Communication Communication: No difficulties   Cognition Arousal/Alertness: Awake/alert Behavior During Therapy: WFL for tasks assessed/performed Overall Cognitive Status: Within Functional Limits for tasks assessed                     General Comments       Exercises       Shoulder Instructions      Home Living Family/patient expects to be discharged to:: Private residence Living Arrangements: Other (Comment) (fiance daughter) Available Help at Discharge: Family;Available 24 hours/day Type of Home: House Home Access: Level entry ((small threshold))     Home Layout: One level  Bathroom Shower/Tub: Therapist, sportsTub/shower unit Shower/tub characteristics: Teacher, early years/preCurtain Bathroom Toilet: Standard     Home Equipment: None   Additional Comments: Pt and daughter report she will have assist with ADLs as long as needed       Prior Functioning/Environment Level of Independence: Independent        Comments: States she was having difficulty walking but capable without buckling or falling        OT Problem List: Decreased strength;Decreased activity  tolerance;Impaired balance (sitting and/or standing);Decreased knowledge of use of DME or AE;Decreased knowledge of precautions;Pain   OT Treatment/Interventions: Self-care/ADL training;DME and/or AE instruction;Therapeutic activities;Patient/family education    OT Goals(Current goals can be found in the care plan section) Acute Rehab OT Goals Patient Stated Goal: regain independence  OT Goal Formulation: With patient Time For Goal Achievement: 08/10/16 ADL Goals Pt Will Perform Grooming: with supervision;standing Pt Will Perform Toileting - Clothing Manipulation and hygiene: with supervision;with adaptive equipment;sit to/from stand Pt Will Perform Tub/Shower Transfer: with min guard assist;ambulating;3 in 1;rolling walker Additional ADL Goal #1: Pt will verbalize/demonstrate understanding re: use of AE for ADLs   OT Frequency: Min 2X/week   Barriers to D/C:            Co-evaluation              End of Session    Activity Tolerance:   Patient left:     Time: [id number] OT Time Calculation (min): 13 min Charges:  OT General Charges $OT Visit: 1 Procedure OT Evaluation $OT Eval Low Complexity: 1 Procedure G-Codes:    Stacey Gordon M 07/27/2016, 7:28 PM

## 2016-07-27 NOTE — Evaluation (Signed)
Physical Therapy Evaluation Patient Details Name: Stacey Gordon MRN: [id number] DOB: [birth year] Today's Date: 07/27/2016   History of Present Illness  60 y.o. female s/p Lumbar four-five, Lumbar five-Sacral one MAXIMUM ACCESS POSTERIOR LUMBAR INTERBODY FUSION .  Clinical Impression  Patient is s/p above procedure, presenting with functional limitations due to the deficits listed below (see PT Problem List). Demonstrates ability to ambulate slowly, more stable with RW for support. Has good family support at home after d/c. Reviewed back precautions. Patient will benefit from skilled PT to increase their independence and safety with mobility to allow discharge to the venue listed below.       Follow Up Recommendations No PT follow up;Supervision for mobility/OOB    Equipment Recommendations  Rolling walker with 5" wheels (Pending progress, may not require RW)    Recommendations for Other Services       Precautions / Restrictions Precautions Precautions: Back Precaution Booklet Issued: Yes (comment) Precaution Comments: Reviewed handout Required Braces or Orthoses: Spinal Brace Spinal Brace: Lumbar corset Restrictions Weight Bearing Restrictions: No      Mobility  Bed Mobility               General bed mobility comments: Pt in restroom when PT entered room  Transfers Overall transfer level: Needs assistance Equipment used: Rolling walker (2 wheeled) Transfers: Sit to/from Stand Sit to Stand: Supervision         General transfer comment: Supervision for safety. VC for technique.   Ambulation/Gait Ambulation/Gait assistance: Supervision Ambulation Distance (Feet): 175 Feet Assistive device: Rolling walker (2 wheeled);None Gait Pattern/deviations: Step-through pattern;Decreased stride length Gait velocity: slow Gait velocity interpretation: <1.8 ft/sec, indicative of risk for recurrent falls General Gait Details: Slow but stable, improved gait speed and  confidence with use of RW for support compared to no assistive device use. Educated on safe DME use with a rolling walker. VC for larger stride length and forward gaze. no buckling noted, able to take 10 steps backwards without loss of balance although very guarded.   Stairs            Wheelchair Mobility    Modified Rankin (Stroke Patients Only)       Balance Overall balance assessment: Needs assistance Sitting-balance support: No upper extremity supported;Feet supported Sitting balance-Leahy Scale: Normal     Standing balance support: No upper extremity supported Standing balance-Leahy Scale: Good           Rhomberg - Eyes Opened: 30 (seconds without loss of balance)                   Pertinent Vitals/Pain Pain Assessment: 0-10 Pain Score: 6  Pain Location: back Pain Descriptors / Indicators: Aching Pain Intervention(s): Monitored during session;Repositioned;Limited activity within patient's tolerance    Home Living Family/patient expects to be discharged to:: Private residence Living Arrangements: Other (Comment) (fiance daughter) Available Help at Discharge: Family;Available 24 hours/day Type of Home: House Home Access: Level entry ((small threshold))     Home Layout: One level Home Equipment: None Additional Comments: States family can stay with for 2 weeks    Prior Function Level of Independence: Independent         Comments: States she was having difficulty walking but capable without buckling or falling     Hand Dominance        Extremity/Trunk Assessment   Upper Extremity Assessment Upper Extremity Assessment: Defer to OT evaluation    Lower Extremity Assessment Lower Extremity Assessment: RLE deficits/detail RLE Deficits /  Details: 4/5 dorsiflexion, inversion, eversion, and hallux extension.       Communication   Communication: No difficulties  Cognition Arousal/Alertness: Awake/alert Behavior During Therapy: WFL for  tasks assessed/performed Overall Cognitive Status: Within Functional Limits for tasks assessed                      General Comments General comments (skin integrity, edema, etc.): Educated on brace use    Exercises General Exercises - Lower Extremity Ankle Circles/Pumps: AROM;Both;20 reps;Seated   Assessment/Plan    PT Assessment Patient needs continued PT services  PT Problem List Decreased strength;Decreased range of motion;Decreased activity tolerance;Decreased balance;Decreased mobility;Decreased knowledge of use of DME;Decreased knowledge of precautions;Pain          PT Treatment Interventions DME instruction;Gait training;Functional mobility training;Therapeutic activities;Therapeutic exercise;Balance training;Neuromuscular re-education;Patient/family education    PT Goals (Current goals can be found in the Care Plan section)  Acute Rehab PT Goals Patient Stated Goal: Get stronger PT Goal Formulation: With patient Time For Goal Achievement: 08/10/16 Potential to Achieve Goals: Good    Frequency Min 5X/week   Barriers to discharge        Co-evaluation               End of Session Equipment Utilized During Treatment: Gait belt;Back brace Activity Tolerance: Patient tolerated treatment well Patient left: in chair;with call bell/phone within reach;with SCD's reapplied Nurse Communication: Mobility status         Time: [id number] PT Time Calculation (min) (ACUTE ONLY): 25 min   Charges:   PT Evaluation $PT Eval Low Complexity: 1 Procedure PT Treatments $Gait Training: 8-22 mins   PT G CodesBerton Mount:        Johntavius Shepard S Kenyatte Chatmon 07/27/2016, 10:36 AM Charlsie MerlesLogan Secor Sadrac Zeoli, PT [phone]

## 2016-07-27 NOTE — Progress Notes (Signed)
Postop day 1. Patient with moderate incisional discomfort but overall pain controlled. No new lower extremity pain numbness or weakness.  Afebrile. Her vitals are stable. Motor exam intact. Sensory nonfocal. Chest and abdomen benign.  Progressing well following 2 level lumbar decompression and fusion. Mobilize with therapy.

## 2016-07-28 DIAGNOSIS — M5116 Intervertebral disc disorders with radiculopathy, lumbar region: Secondary | ICD-10-CM | POA: Diagnosis not present

## 2016-07-28 MED ORDER — METHOCARBAMOL 500 MG PO TABS: 500.0000 mg | ORAL_TABLET | Freq: Four times a day (QID) | ORAL | 1 refills | Status: AC | PRN

## 2016-07-28 MED ORDER — OXYCODONE-ACETAMINOPHEN 5-325 MG PO TABS: 1.0000 | ORAL_TABLET | ORAL | 0 refills | Status: AC | PRN

## 2016-07-28 NOTE — Care Management Note (Signed)
Case Management Note  Patient Details  Name: Emi Holesatricia L Mckeag MRN: [id number] Date of Birth: 17-Oct-1955  Subjective/Objective:                  MAXIMUM ACCESS POSTERIOR LUMBAR INTERBODY FUSION   Action/Plan: CM spoke with patient. Patient states she needs a RW and 3N1. [facility] scheduled for [facility]. DME will be delivered to patient's room prior to discharge. Patient states her daughter will be able to assist her at home when discharged. No home health recommended per last PT and OT notes.   Expected Discharge Date:   07/28/16               Expected Discharge Plan:  Home/Self Care  In-House Referral:     Discharge planning Services  CM Consult  Post Acute Care Choice:  NA Choice offered to:  NA  DME Arranged:  3-N-1, Walker rolling DME Agency:  [facility]  HH Arranged:  NA HH Agency:  NA  Status of Service:  Completed, signed off  If discussed at Long Length of Stay Meetings, dates discussed:    Additional Comments:  Antony HasteBennett, Breianna Delfino Harris, RN 07/28/2016, 2:17 PM

## 2016-07-28 NOTE — Progress Notes (Signed)
Patient is being d/c home. Dc instructions given and patient verbalized understanding. Patient awaiting walker and 3- in- 1.

## 2016-07-28 NOTE — Discharge Summary (Signed)
Physician Discharge Summary  Patient ID: Stacey Gordon MRN: [id number] DOB/AGE: 59-Oct-1957 59 y.o.  Admit date: 07/26/2016 Discharge date: 07/28/2016  Admission Diagnoses:  Discharge Diagnoses:  Active Problems:   Spondylolisthesis of lumbar region   Discharged Condition: good  Hospital Course: Patient admitted the hospital where she underwent an uncomplicated two-level lumbar decompression and fusion. Postoperatively she is doing very well. Preoperative back and lower extremity pain improved. Patient mobilizing without difficulty  Consults:   Significant Diagnostic Studies:   Treatments:   Discharge Exam: Blood pressure (!) 137/52, pulse 83, temperature 99 F (37.2 C), temperature source Oral, resp. rate 18, height 5\' 4"  (1.626 m), weight 84 kg (185 lb 3 oz), SpO2 96 %. Awake and alert. Oriented and appropriate. Cranial nerve function intact. Motor and sensory function extremities normal. Wound clean and dry. Chest and abdomen benign.  Disposition: Final discharge disposition not confirmed   Allergies as of 07/28/2016      Reactions   No Known Allergies       Medication List    STOP taking these medications   HYDROcodone-acetaminophen 5-325 MG tablet Commonly known as:  NORCO/VICODIN     TAKE these medications   amLODipine 10 MG tablet Commonly known as:  NORVASC TAKE 1 TABLET DAILY   lidocaine 5 % Commonly known as:  LIDODERM Place 1 patch onto the skin daily as needed (PAIN). Remove & Discard patch within 12 hours or as directed by MD   methocarbamol 500 MG tablet Commonly known as:  ROBAXIN Take 1 tablet (500 mg total) by mouth every 6 (six) hours as needed for muscle spasms.   multivitamin with minerals Tabs tablet Take 1 tablet by mouth daily.   oxyCODONE-acetaminophen 5-325 MG tablet Commonly known as:  PERCOCET/ROXICET Take 1-2 tablets by mouth every 4 (four) hours as needed for moderate pain.      Follow-up Information    STERN,JOSEPH D,  MD. Call.   Specialty:  Neurosurgery Contact information: [address] [place] [address] [phone]           Signed: Temple PaciniOOL,Barba Solt A 07/28/2016, 12:30 PM

## 2016-07-28 NOTE — Progress Notes (Signed)
Occupational Therapy Treatment Patient Details Name: Stacey Gordon MRN: [id number] DOB: [birth year] Today's Date: 07/28/2016    History of present illness 60 y.o. female s/p Lumbar four-five, Lumbar five-Sacral one MAXIMUM ACCESS POSTERIOR LUMBAR INTERBODY FUSION .   OT comments  Pt. Able to demonstrate safe tub transfer, along with bed mobility and in room ambulation.  Will have dtr. Assisting 24/7 and declines need for A/E.  No further acute OT needs at this time.  Will alert OTR/L to sign off.   Follow Up Recommendations  No OT follow up;Supervision/Assistance - 24 hour    Equipment Recommendations  3 in 1 bedside commode    Recommendations for Other Services      Precautions / Restrictions Precautions Precautions: Back Required Braces or Orthoses: Spinal Brace Spinal Brace: Lumbar corset Restrictions Weight Bearing Restrictions: No       Mobility Bed Mobility S, HOB FLAT NO RAILS, safe demonstration of log roll technique.                   Transfers S for sit/stand and transfers.  Good walker management and maintaining back precautions.                        Balance                                   ADL Overall ADL's : Needs assistance/impaired     Grooming: Supervision/safety;Standing Grooming Details (indicate cue type and reason): simulated during in room ambulation and ADL completion       Lower Body Bathing Details (indicate cue type and reason): declines need for A/E states her dtr. will be with her all of the time and able to assist with LB ADLS       Lower Body Dressing Details (indicate cue type and reason): declines need for A/E states her dtr. will be with her all of the time and able to assist with LB ADLS Toilet Transfer: Supervision/safety;RW;Ambulation Toilet Transfer Details (indicate cue type and reason): 3n1 over commode, plans to do the same at home Toileting- Clothing Manipulation and Hygiene:  Supervision/safety;Maximal assistance;Sit to/from stand Toileting - Clothing Manipulation Details (indicate cue type and reason): able to perform pericare in standing S, but needs max a to clean buttocks.  dtr. will be assisting with this Tub/ Shower Transfer: Tub transfer;Min guard;Ambulation;Rolling walker;Grab bars Tub/Shower Transfer Details (indicate cue type and reason): pt. able to return demo of same tub transfer for L faucet.  has grab bars and will use a 3n1.  able to verbalize each step as she completed for reinforcement so she could instruct her dtr. for correct hand placement to manage/stabalize RW in/out of tub Functional mobility during ADLs: Min guard;Rolling walker General ADL Comments: pt. states she has no A/E equipment needs as her dtr. will be assisting as needed      Vision                     Perception     Praxis      Cognition   Behavior During Therapy: [facility] for tasks assessed/performed Overall Cognitive Status: Within Functional Limits for tasks assessed                       Extremity/Trunk Assessment  Exercises     Shoulder Instructions       General Comments      Pertinent Vitals/ Pain       Pain Assessment: No/denies pain  Home Living                                          Prior Functioning/Environment              Frequency  Min 2X/week        Progress Toward Goals  OT Goals(current goals can now be found in the care plan section)  Progress towards OT goals: Goals met/education completed, patient discharged from [place] During Treatment: Gait belt;Rolling walker;Back brace   Activity Tolerance Patient tolerated treatment well   Patient Left in chair;with call bell/phone within reach   Nurse Communication          Time: 6962-9528 OT Time Calculation (min): 19 min  Charges: OT  General Charges $OT Visit: 1 Procedure OT Treatments $Self Care/Home Management : 8-22 mins  Janice Coffin, COTA/L 07/28/2016, 11:57 AM

## 2016-07-29 ENCOUNTER — Encounter (HOSPITAL_COMMUNITY): Payer: Self-pay | Admitting: Neurosurgery

## 2018-01-21 ENCOUNTER — Telehealth: Payer: Self-pay | Admitting: Family Medicine

## 2018-01-21 NOTE — Telephone Encounter (Signed)
Patient left message 01/20/18 pm to make an appt, I called her back this morning. No answer, left message for her to call back & schedule.

## 2018-08-14 IMAGING — RF DG LUMBAR SPINE 2-3V
1 series · 8 of 8 positions shown · non-contrast
Comparison: None.

CLINICAL DATA: L4-L5 and L5-S1 PLIF

EXAM:
LUMBAR SPINE - 2-3 VIEW; DG C-ARM 61-120 MIN

[Series 1: run · 8 of 8 slices shown]
[im 1/8]
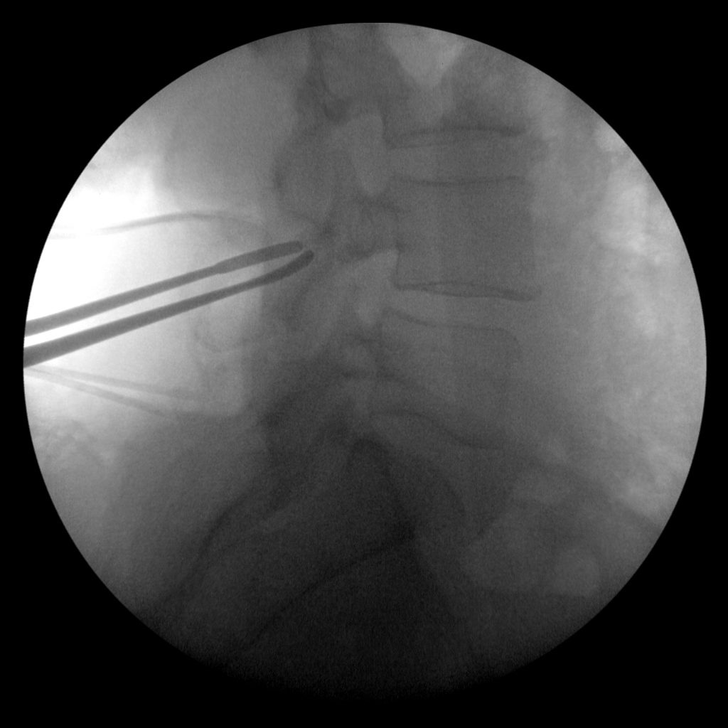
[im 2/8]
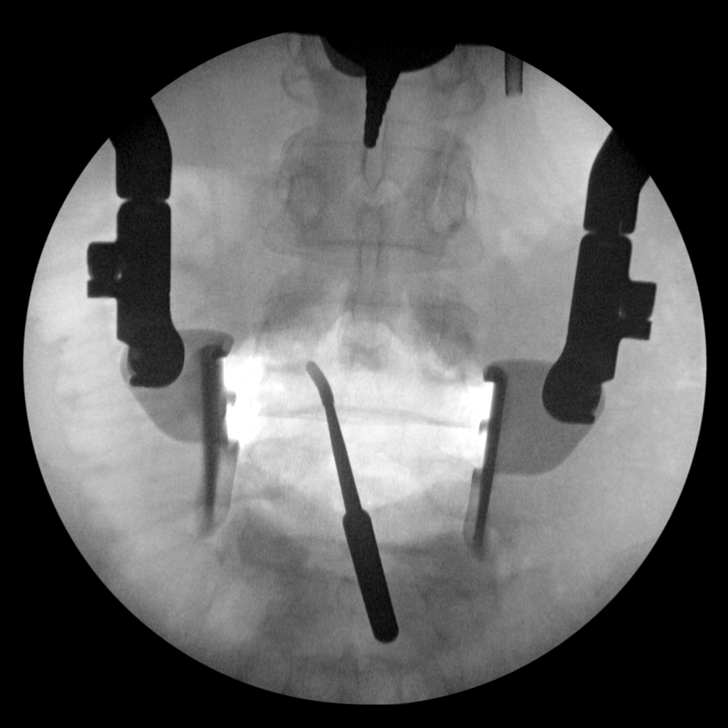
[im 3/8]
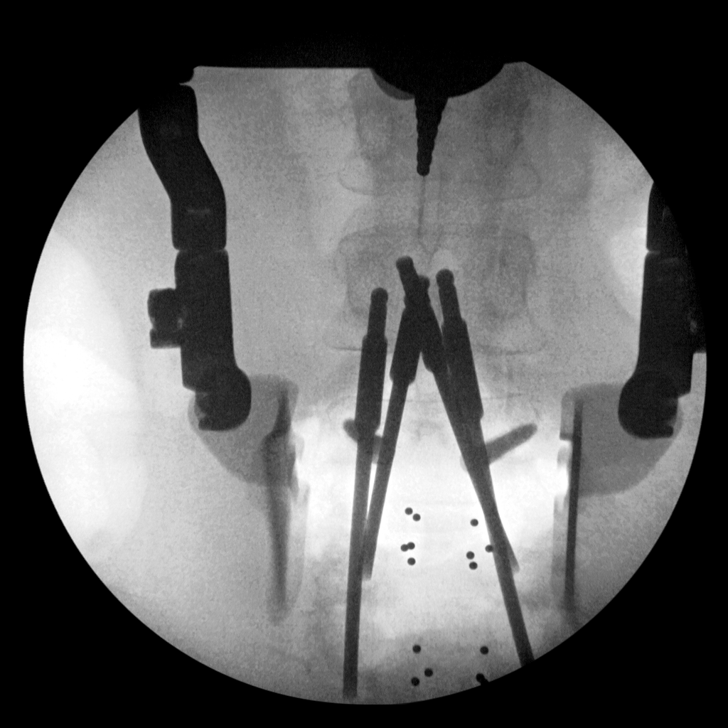
[im 4/8]
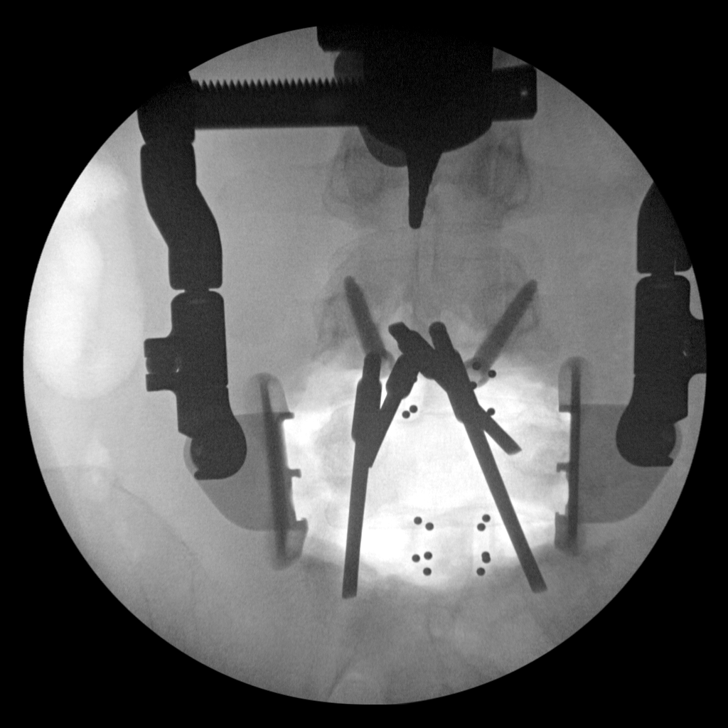
[im 5/8]
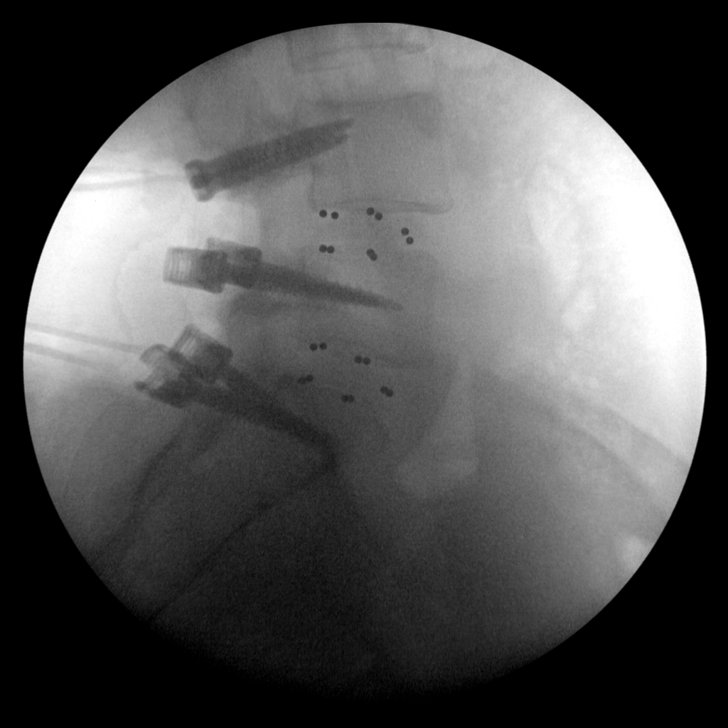
[im 6/8]
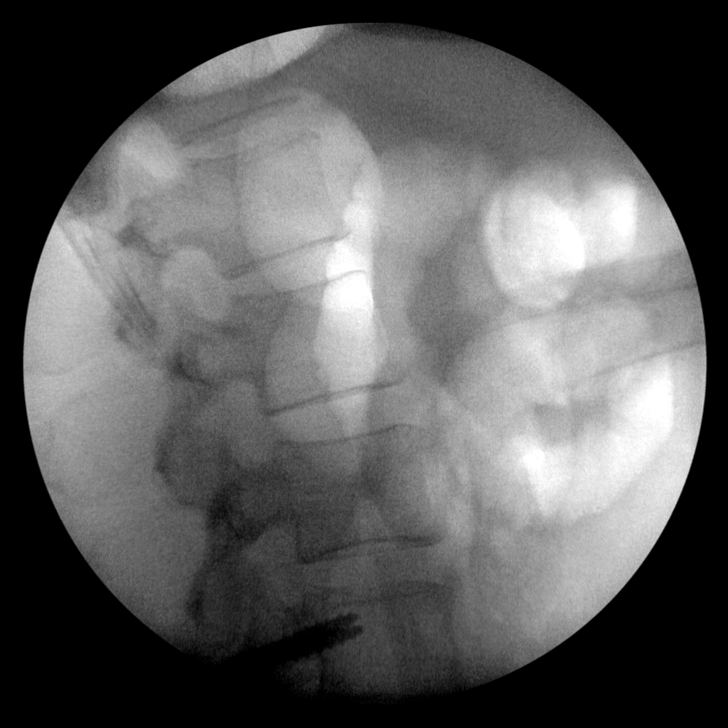
[im 7/8]
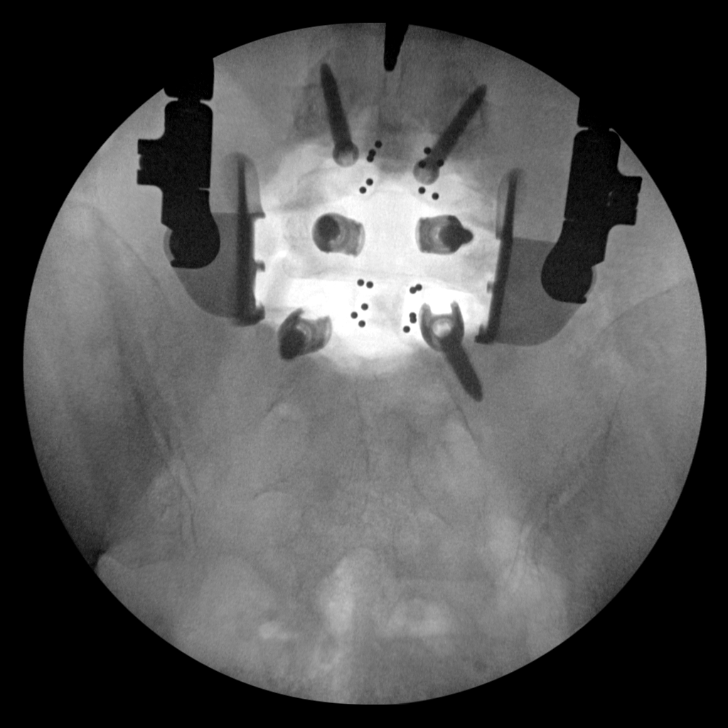
[im 8/8]
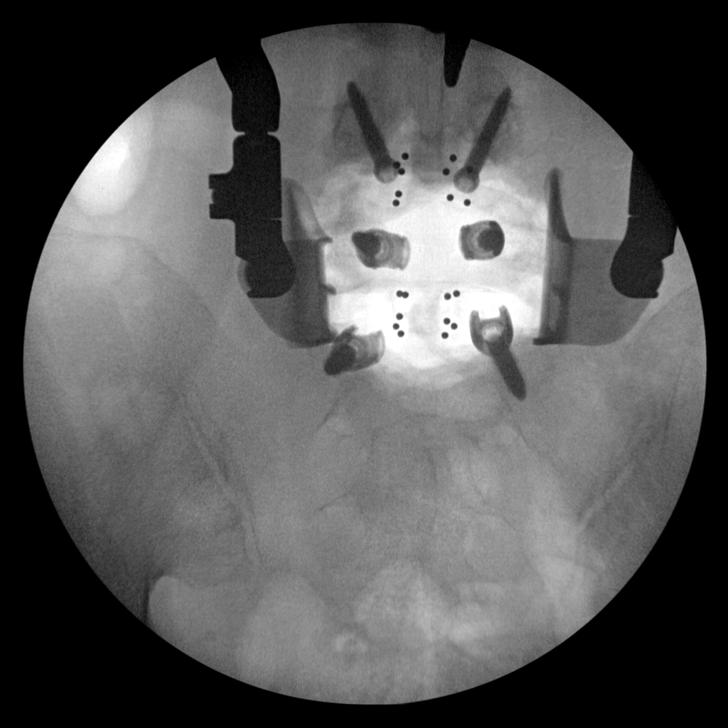

[8 of 8 positions shown; findings below may reference images not displayed]

FINDINGS: Multiple C-arm images in the operating retained demonstrating lumbar
localization and placement of bilateral pedicle screws at L4, L5,
and S1 in good position. Interbody spacers at L4-5 and L5-S1.
Posterior rods not placed on the last image obtained.
IMPRESSION: PLIF L4-5 and L5-S1.
